# Patient Record
Sex: Male | Born: 1985 | Race: Black or African American | Hispanic: No | Marital: Single | Smoking: Never smoker
Health system: Southern US, Community
[De-identification: ages and names within clinical notes are randomized; demographics above are authoritative.]

---

## 2015-02-03 ENCOUNTER — Emergency Department (HOSPITAL_COMMUNITY)
Admission: EM | Admit: 2015-02-03 | Discharge: 2015-02-03 | Disposition: A | Discharge status: Home / Self Care | Payer: Self-pay | Attending: Emergency Medicine | Admitting: Emergency Medicine

## 2015-02-03 ENCOUNTER — Encounter (HOSPITAL_COMMUNITY): Payer: Self-pay | Admitting: *Deleted

## 2015-02-03 DIAGNOSIS — Z202 Contact with and (suspected) exposure to infections with a predominantly sexual mode of transmission: Secondary | ICD-10-CM | POA: Insufficient documentation

## 2015-02-03 DIAGNOSIS — R350 Frequency of micturition: Secondary | ICD-10-CM | POA: Insufficient documentation

## 2015-02-03 DIAGNOSIS — Z7251 High risk heterosexual behavior: Secondary | ICD-10-CM

## 2015-02-03 DIAGNOSIS — R300 Dysuria: Secondary | ICD-10-CM | POA: Insufficient documentation

## 2015-02-03 LAB — URINALYSIS, ROUTINE W REFLEX MICROSCOPIC
Bilirubin Urine: NEGATIVE
GLUCOSE, UA: NEGATIVE mg/dL
HGB URINE DIPSTICK: NEGATIVE
KETONES UR: NEGATIVE mg/dL
LEUKOCYTES UA: NEGATIVE
Nitrite: NEGATIVE
Protein, ur: NEGATIVE mg/dL
SPECIFIC GRAVITY, URINE: 1.021 (ref 1.005–1.030)
pH: 6 (ref 5.0–8.0)

## 2015-02-03 MED ORDER — METRONIDAZOLE 500 MG PO TABS
2000.0000 mg | ORAL_TABLET | Freq: Once | ORAL | Status: AC
  Administered 2015-02-03: 2000 mg via ORAL
  Filled 2015-02-03: qty 4

## 2015-02-03 MED ORDER — AZITHROMYCIN 250 MG PO TABS
1000.0000 mg | ORAL_TABLET | Freq: Once | ORAL | Status: AC
  Administered 2015-02-03: 1000 mg via ORAL
  Filled 2015-02-03: qty 4

## 2015-02-03 MED ORDER — CEFTRIAXONE SODIUM 250 MG IJ SOLR
250.0000 mg | Freq: Once | INTRAMUSCULAR | Status: AC
  Administered 2015-02-03: 250 mg via INTRAMUSCULAR
  Filled 2015-02-03: qty 250

## 2015-02-03 NOTE — Discharge Instructions (Signed)
Follow up with [facility] STD clinic for future STD concerns or screenings. This is the recommendation by the CDC for people with multiple sexual partners or history of STDs. You have been treated for trichomonas, gonorrhea, and chlamydia in the ER but the hospital will call you if lab is positive. Stay well hydrated. Have all sexual partners tested and treated. Do not engage in unprotected sex, and avoid sexual activity until your partner's symptoms resolve entirely. Follow up with [place] and wellness in 1-2 weeks to establish medical care. Return to the ER for changes or worsening symptoms   Sexually Transmitted Disease A sexually transmitted disease (STD) is a disease or infection often passed to another person during sex. However, STDs can be passed through nonsexual ways. An STD can be passed through:  Spit (saliva).  Semen.  Blood.  Mucus from the vagina.  Pee (urine). HOW CAN I LESSEN MY CHANCES OF GETTING AN STD?  Use:  Latex condoms.  Water-soluble lubricants with condoms. Do not use petroleum jelly or oils.  Dental dams. These are small pieces of latex that are used as a barrier during oral sex.  Avoid having more than one sex partner.  Do not have sex with someone who has other sex partners.  Do not have sex with anyone you do not know or who is at high risk for an STD.  Avoid risky sex that can break your skin.  Do not have sex if you have open sores on your mouth or skin.  Avoid drinking too much alcohol or taking illegal drugs. Alcohol and drugs can affect your good judgment.  Avoid oral and anal sex acts.  Get shots (vaccines) for HPV and hepatitis.  If you are at risk of being infected with HIV, it is advised that you take a certain medicine daily to prevent HIV infection. This is called pre-exposure prophylaxis (PrEP). You may be at risk if:  You are a man who has sex with other men (MSM).  You are attracted to the opposite sex  (heterosexual) and are having sex with more than one partner.  You take drugs with a needle.  You have sex with someone who has HIV.  Talk with your doctor about if you are at high risk of being infected with HIV. If you begin to take PrEP, get tested for HIV first. Get tested every 3 months for as long as you are taking PrEP.  Get tested for STDs every year if you are sexually active. If you are treated for an STD, get tested again 3 months after you are treated. WHAT SHOULD I DO IF I THINK I HAVE AN STD?  See your doctor.  Tell your sex partner(s) that you have an STD. They should be tested and treated.  Do not have sex until your doctor says it is okay. WHEN SHOULD I GET HELP? Get help right away if:  You have bad belly (abdominal) pain.  You are a man and have puffiness (swelling) or pain in your testicles.  You are a woman and have puffiness in your vagina.   This information is not intended to replace advice given to you by your health care provider. Make sure you discuss any questions you have with your health care provider.   Document Released: 02/06/2004 Document Revised: 01/19/2014 Document Reviewed: 06/24/2012 Elsevier Interactive Patient Education 2016 [employer] Inc[employer]  Dysuria Dysuria is pain or discomfort while urinating. The pain or discomfort may be felt in  the tube that carries urine out of the bladder (urethra) or in the surrounding tissue of the genitals. The pain may also be felt in the groin area, lower abdomen, and lower back. You may have to urinate frequently or have the sudden feeling that you have to urinate (urgency). Dysuria can affect both men and women, but is more common in women. Dysuria can be caused by many different things, including:  Urinary tract infection in women.  Infection of the kidney or bladder.  Kidney stones or bladder stones.  Certain sexually transmitted infections (STIs), such as chlamydia.  Dehydration.  Inflammation of the  vagina.  Use of certain medicines.  Use of certain soaps or scented products that cause irritation. HOME CARE INSTRUCTIONS Watch your dysuria for any changes. The following actions may help to reduce any discomfort you are feeling:  Drink enough fluid to keep your urine clear or pale yellow.  Empty your bladder often. Avoid holding urine for long periods of time.  After a bowel movement or urination, women should cleanse from front to back, using each tissue only once.  Empty your bladder after sexual intercourse.  Take medicines only as directed by your health care provider.  If you were prescribed an antibiotic medicine, finish it all even if you start to feel better.  Avoid caffeine, tea, and alcohol. They can irritate the bladder and make dysuria worse. In men, alcohol may irritate the prostate.  Keep all follow-up visits as directed by your health care provider. This is important.  If you had any tests done to find the cause of dysuria, it is your responsibility to obtain your test results. Ask the lab or department performing the test when and how you will get your results. Talk with your health care provider if you have any questions about your results. SEEK MEDICAL CARE IF:  You develop pain in your back or sides.  You have a fever.  You have nausea or vomiting.  You have blood in your urine.  You are not urinating as often as you usually do. SEEK IMMEDIATE MEDICAL CARE IF:  You pain is severe and not relieved with medicines.  You are unable to hold down any fluids.  You or someone else notices a change in your mental function.  You have a rapid heartbeat at rest.  You have shaking or chills.  You feel extremely weak.   This information is not intended to replace advice given to you by your health care provider. Make sure you discuss any questions you have with your health care provider.   Document Released: 09/27/2003 Document Revised: 01/19/2014 Document  Reviewed: 08/24/2013 Elsevier Interactive Patient Education [employer]

## 2015-02-03 NOTE — ED Notes (Signed)
Pt left with out being discharged.

## 2015-02-03 NOTE — ED Provider Notes (Signed)
CSN: [id number]     Arrival date & time 02/03/15  6962 History   First MD Initiated Contact with Patient 02/03/15 1009     Chief Complaint  Patient presents with  . Exposure to STD     (Consider location/radiation/quality/duration/timing/severity/associated sxs/prior Treatment) HPI Comments: James Horton is a 30 y.o. male who presents to the ED with complaints of concern for STD exposure. Patient states that last month his significant other and himself were tested and empirically treated for gonorrhea and chlamydia (states he got a shot and pills), and that his test result was negative. Last week his girlfriend tested positive for chlamydia, it's unclear whether she received full treatment at that time, but patient did not get retested. He is here today to be retreated for gonorrhea and chlamydia given his significant other's positive test. Of note, his S.O. Is here as well, she was tested for GC/CT last week and tested +chlamydia. Had neg RPR/HIV testing. She was apparently treated but she didn't know what she was given. She is here today and has been re-treated for GC/CT and trichomonas as well as a yeast infection since she also had that show up on her testing last week.  Patient states he has been having some burning dysuria and increased urinary frequency occasionally. He states he is only sexually active with one male partner, unprotected. He denies any fevers, chills, chest pain, shortness breath, abdominal pain, nausea, vomiting, diarrhea, constipation, malodorous urine, hematuria, penile discharge or pain, penile swelling, testicular pain or swelling, genital lesions, numbness, tingling, or weakness.  Patient is a 30 y.o. male presenting with STD exposure. The history is provided by the patient and a significant other. No language interpreter was used.  Exposure to STD This is a new problem. The current episode started in the past 7 days. The problem occurs constantly. The problem has  been unchanged. Associated symptoms include urinary symptoms. Pertinent negatives include no abdominal pain, arthralgias, chest pain, chills, fever, myalgias, nausea, numbness, vomiting or weakness. Nothing aggravates the symptoms. He has tried nothing for the symptoms. The treatment provided no relief.    History reviewed. No pertinent past medical history. History reviewed. No pertinent past surgical history. No family history on file. Social History  Substance Use Topics  . Smoking status: Never Smoker   . Smokeless tobacco: None  . Alcohol Use: No    Review of Systems  Constitutional: Negative for fever and chills.  Respiratory: Negative for shortness of breath.   Cardiovascular: Negative for chest pain.  Gastrointestinal: Negative for nausea, vomiting, abdominal pain, diarrhea and constipation.  Genitourinary: Positive for dysuria and frequency. Negative for hematuria, discharge, penile swelling, scrotal swelling, genital sores, penile pain and testicular pain.  Musculoskeletal: Negative for myalgias and arthralgias.  Skin: Negative for color change.  Allergic/Immunologic: Negative for immunocompromised state.  Neurological: Negative for weakness and numbness.  Psychiatric/Behavioral: Negative for confusion.   10 Systems reviewed and are negative for acute change except as noted in the HPI.    Allergies  Review of patient's allergies indicates no known allergies.  Home Medications   Prior to Admission medications   Not on File   Triage VS: BP 91/58 mmHg  Pulse 71  Temp(Src) 98.4 F (36.9 C) (Oral)  Resp 18  SpO2 100% Recheck: BP 108/71 mmHg  Pulse 62  Temp(Src) 98.4 F (36.9 C) (Oral)  Resp 17  SpO2 100%  Physical Exam  Constitutional: He is oriented to person, place, and time. Vital signs are normal.  He appears well-developed and well-nourished.  Non-toxic appearance. No distress.  Afebrile, nontoxic, NAD  HENT:  Head: Normocephalic and atraumatic.   Mouth/Throat: Oropharynx is clear and moist and mucous membranes are normal.  Eyes: Conjunctivae and EOM are normal. Right eye exhibits no discharge. Left eye exhibits no discharge.  Neck: Normal range of motion. Neck supple.  Cardiovascular: Normal rate, regular rhythm, normal heart sounds and intact distal pulses.  Exam reveals no gallop and no friction rub.   No murmur heard. Pulmonary/Chest: Effort normal and breath sounds normal. No respiratory distress. He has no decreased breath sounds. He has no wheezes. He has no rhonchi. He has no rales.  Abdominal: Soft. Normal appearance and bowel sounds are normal. He exhibits no distension. There is no tenderness. There is no rigidity, no rebound, no guarding, no CVA tenderness, no tenderness at McBurney's point and negative Murphy's sign.  Soft, NTND, +BS throughout, no r/g/r, neg murphy's, neg mcburney's, no CVA TTP   Genitourinary:  Deferred given recent GU exam  Musculoskeletal: Normal range of motion.  Neurological: He is alert and oriented to person, place, and time. He has normal strength. No sensory deficit.  Skin: Skin is warm, dry and intact. No rash noted.  Psychiatric: He has a normal mood and affect.  Nursing note and vitals reviewed.   ED Course  Procedures (including critical care time) Labs Review Labs Reviewed  URINALYSIS, ROUTINE W REFLEX MICROSCOPIC (NOT AT [facility])  GC/CHLAMYDIA PROBE AMP ([place]) NOT AT [facility]    Imaging Review No results found. I have personally reviewed and evaluated these images and lab results as part of my medical decision-making.   EKG Interpretation None      MDM   Final diagnoses:  Exposure to STD  Unprotected sex  Dysuria  Urinary frequency    30 y.o. male here for STD concerns. Was tested last month, treated empirically with shot and pills, and results were neg. S.O. Was also tested and treated at that time too. Last week, S.O. Was re-tested and had +chlamydia test. Unclear if  pt was fully treated at that time, again, but is here today and is being retreated for GC/CT/trich and yeast. Pt states he was not retested/treated last week (since his S.O. Was), and wants to be re-treated given the +result on his S.O. Testing. Has some dysuria and urinary frequency. Since pt just had penile exam and testing, and his S.O. Is + for chlamydia, will just empirically treat for GC/CT/trich today. Will get U/A and test for GC/CT again off this, but since his test last time was likely a false neg, still will proceed with tx. Doubt need for repeat GU exam today. Will await U/A then reassess.   11:24 AM U/A clear. Doubt need for reflex UCx given the fact that it's completely clear. Pt treated for GC/CT/Trich here. Discussed importance of using condoms. F/up with health dept for future STD concerns, and with [facility] in 1-2wks to establish medical care. I explained the diagnosis and have given explicit precautions to return to the ER including for any other new or worsening symptoms. The patient understands and accepts the medical plan as it's been dictated and I have answered their questions. Discharge instructions concerning home care and prescriptions have been given. The patient is STABLE and is discharged to home in good condition.  BP 108/71 mmHg  Pulse 62  Temp(Src) 98.4 F (36.9 C) (Oral)  Resp 17  SpO2 100%  Meds ordered this encounter  Medications  .  azithromycin (ZITHROMAX) tablet 1,000 mg    Sig:    And  . cefTRIAXone (ROCEPHIN) injection 250 mg    Sig:     Order Specific Question:  Antibiotic Indication:    Answer:  STD   And  . metroNIDAZOLE (FLAGYL) tablet 2,000 mg    Sig:       Kamarii Carton Camprubi-Soms, PA-C 02/03/15 1124  Pricilla Loveless, MD 02/05/15 [phone]

## 2015-02-03 NOTE — ED Notes (Signed)
Pt reports being tested last week Monday for STD and was negative.  Pt reports discomfort when urinating.  Pt's girlfriend is also being seen here today and states that she was checked last week and was + for chlamydia.

## 2015-05-10 ENCOUNTER — Emergency Department (HOSPITAL_COMMUNITY)
Admission: EM | Admit: 2015-05-10 | Discharge: 2015-05-10 | Disposition: A | Discharge status: Home / Self Care | Payer: Medicaid Other | Attending: Emergency Medicine | Admitting: Emergency Medicine

## 2015-05-10 ENCOUNTER — Encounter (HOSPITAL_COMMUNITY): Payer: Self-pay | Admitting: Emergency Medicine

## 2015-05-10 DIAGNOSIS — M25532 Pain in left wrist: Secondary | ICD-10-CM | POA: Insufficient documentation

## 2015-05-10 DIAGNOSIS — M654 Radial styloid tenosynovitis [de Quervain]: Secondary | ICD-10-CM | POA: Diagnosis not present

## 2015-05-10 DIAGNOSIS — M25531 Pain in right wrist: Secondary | ICD-10-CM | POA: Diagnosis present

## 2015-05-10 MED ORDER — NAPROXEN 500 MG PO TABS: 500.0000 mg | ORAL_TABLET | Freq: Two times a day (BID) | ORAL | Status: DC

## 2015-05-10 NOTE — ED Provider Notes (Signed)
CSN: [id number]     Arrival date & time 05/10/15  [id number] History   First MD Initiated Contact with Patient 05/10/15 1015     Chief Complaint  Patient presents with  . Wrist Pain   (Consider location/radiation/quality/duration/timing/severity/associated sxs/prior Treatment) HPI 30 y.o. male with a hx of Right Carpal Tunnel, presents to the Emergency Department today complaining of bilateral wrist pain since 05-03-15. Pt states that he began having a pressure sensation on bilateral wrists with use. Notes stiff/shooting sensation on wrist with use at work. Pain is 7-8/10 and comes and goes. Has tried Ibuprofen with minimal relief. Denies any injury/trauma to the area. No fevers. No other symptoms noted.   History reviewed. No pertinent past medical history. History reviewed. No pertinent past surgical history. No family history on file. Social History  Substance Use Topics  . Smoking status: Never Smoker   . Smokeless tobacco: None  . Alcohol Use: No    Review of Systems  Constitutional: Negative for fever.  Musculoskeletal: Positive for arthralgias. Negative for myalgias and joint swelling.   Allergies  Review of patient's allergies indicates no known allergies.  Home Medications   Prior to Admission medications   Not on File   BP 99/59 mmHg  Pulse 67  Temp(Src) 98.2 F (36.8 C) (Oral)  Resp 12  Ht 5\' 6"  (1.676 m)  Wt 53.524 kg  BMI 19.05 kg/m2  SpO2 99%   Physical Exam  Constitutional: He is oriented to person, place, and time. He appears well-developed and well-nourished.  HENT:  Head: Normocephalic and atraumatic.  Eyes: EOM are normal.  Cardiovascular: Normal rate and regular rhythm.   Pulmonary/Chest: Effort normal.  Abdominal: Soft.  Musculoskeletal: Normal range of motion.       Right wrist: He exhibits tenderness. He exhibits normal range of motion, no bony tenderness, no swelling, no effusion, no crepitus, no deformity and no laceration.       Left wrist: He  exhibits tenderness. He exhibits normal range of motion, no bony tenderness, no swelling, no effusion, no crepitus, no deformity and no laceration.  Positive Finklestein bilateral wrists. Neg Phalens. Distal pulses intact. Cap refill <2sec. Neurovascularly intact. Good motor/sensation   Neurological: He is alert and oriented to person, place, and time.  Skin: Skin is warm and dry.  Psychiatric: He has a normal mood and affect. His behavior is normal. Thought content normal.  Nursing note and vitals reviewed.  ED Course  Procedures (including critical care time) Labs Review Labs Reviewed - No data to display  Imaging Review No results found. I have personally reviewed and evaluated these images and lab results as part of my medical decision-making.   EKG Interpretation None      MDM  I have reviewed the relevant previous healthcare records. I obtained HPI from historian.  ED Course:  Assessment: Pt is a 30yM with hx Carpal tunnel who presents with bilateral wrist pain movement. No trauma to the area. On exam, pt in NAD. Nontoxic/nonseptic appearing. VSS. Afebrile. Pain with ROM of wrist. Positive Finkle. Neg Phalens. Neurovascularly intact. Suspect DeQuervains Tenosynovitis. Given splint. NSAIDs. Follow up with Orthopedics for further evaluation. Plan is to DC home. At time of discharge, Patient is in no acute distress. Vital Signs are stable. Patient is able to ambulate. Patient able to tolerate PO.    Disposition/Plan:  DC Home Additional Verbal discharge instructions given and discussed with patient.  Pt Instructed to f/u with Ortho for evaluation and treatment of symptoms. Return  precautions given Pt acknowledges and agrees with plan  Supervising Physician Donnetta Hutching, MD   Final diagnoses:  Suzette Battiest tenosynovitis, bilateral     Audry Pili, PA-C 05/10/15 1049  Donnetta Hutching, MD 05/10/15 [phone]

## 2015-05-10 NOTE — Discharge Instructions (Signed)
Please read and follow all provided instructions.  Your diagnoses today include:  1. De Quervain's tenosynovitis, bilateral    Tests performed today include:  Vital signs. See below for your results today.   Medications prescribed:   Take as prescribed    Home care instructions:  Follow any educational materials contained in this packet.  Follow-up instructions: Please follow-up with your Orthopedic Surgery for further evaluation of symptoms and treatment   Return instructions:   Please return to the Emergency Department if you do not get better, if you get worse, or new symptoms OR  - Fever (temperature greater than 101.91F)  - Bleeding that does not stop with holding pressure to the area    -Severe pain (please note that you may be more sore the day after your accident)  - Chest Pain  - Difficulty breathing  - Severe nausea or vomiting  - Inability to tolerate food and liquids  - Passing out  - Skin becoming red around your wounds  - Change in mental status (confusion or lethargy)  - New numbness or weakness     Please return if you have any other emergent concerns.  Additional Information:  Your vital signs today were: BP 99/59 mmHg   Pulse 67   Temp(Src) 98.2 F (36.8 C) (Oral)   Resp 12   Ht 5\' 6"  (1.676 m)   Wt 53.524 kg   BMI 19.05 kg/m2   SpO2 99% If your blood pressure (BP) was elevated above 135/85 this visit, please have this repeated by your doctor within one month. ---------------

## 2015-05-10 NOTE — ED Notes (Signed)
Bed: WA27 Expected date:  Expected time:  Means of arrival:  Comments: 

## 2015-05-10 NOTE — ED Notes (Signed)
Pt reports bilateral wrist pain onset 4/21; denies injury; reports hx of right carpel tunnel.

## 2016-05-18 ENCOUNTER — Emergency Department (HOSPITAL_COMMUNITY)
Admission: EM | Admit: 2016-05-18 | Discharge: 2016-05-18 | Disposition: A | Discharge status: Home / Self Care | Payer: Medicaid Other | Attending: Emergency Medicine | Admitting: Emergency Medicine

## 2016-05-18 ENCOUNTER — Encounter (HOSPITAL_COMMUNITY): Payer: Self-pay | Admitting: Emergency Medicine

## 2016-05-18 DIAGNOSIS — R112 Nausea with vomiting, unspecified: Secondary | ICD-10-CM

## 2016-05-18 DIAGNOSIS — R197 Diarrhea, unspecified: Secondary | ICD-10-CM | POA: Insufficient documentation

## 2016-05-18 LAB — URINALYSIS, ROUTINE W REFLEX MICROSCOPIC
Bacteria, UA: NONE SEEN
Bilirubin Urine: NEGATIVE
GLUCOSE, UA: NEGATIVE mg/dL
Hgb urine dipstick: NEGATIVE
Ketones, ur: 20 mg/dL — AB
Leukocytes, UA: NEGATIVE
NITRITE: NEGATIVE
PH: 5 (ref 5.0–8.0)
PROTEIN: 30 mg/dL — AB
SPECIFIC GRAVITY, URINE: 1.032 — AB (ref 1.005–1.030)

## 2016-05-18 LAB — CBC
HCT: 43.7 % (ref 39.0–52.0)
Hemoglobin: 15.1 g/dL (ref 13.0–17.0)
MCH: 30.4 pg (ref 26.0–34.0)
MCHC: 34.6 g/dL (ref 30.0–36.0)
MCV: 87.9 fL (ref 78.0–100.0)
PLATELETS: 220 10*3/uL (ref 150–400)
RBC: 4.97 MIL/uL (ref 4.22–5.81)
RDW: 13.1 % (ref 11.5–15.5)
WBC: 9 10*3/uL (ref 4.0–10.5)

## 2016-05-18 LAB — COMPREHENSIVE METABOLIC PANEL
ALBUMIN: 4.3 g/dL (ref 3.5–5.0)
ALK PHOS: 48 U/L (ref 38–126)
ALT: 26 U/L (ref 17–63)
AST: 30 U/L (ref 15–41)
Anion gap: 9 (ref 5–15)
BILIRUBIN TOTAL: 0.6 mg/dL (ref 0.3–1.2)
BUN: 13 mg/dL (ref 6–20)
CALCIUM: 9.5 mg/dL (ref 8.9–10.3)
CO2: 26 mmol/L (ref 22–32)
CREATININE: 1 mg/dL (ref 0.61–1.24)
Chloride: 103 mmol/L (ref 101–111)
GFR calc Af Amer: >60 mL/min (ref >60)
GFR calc non Af Amer: >60 mL/min (ref >60)
GLUCOSE: 112 mg/dL — AB (ref 65–99)
Potassium: 3.6 mmol/L (ref 3.5–5.1)
SODIUM: 138 mmol/L (ref 135–145)
TOTAL PROTEIN: 7.8 g/dL (ref 6.5–8.1)

## 2016-05-18 LAB — LIPASE, BLOOD: Lipase: 19 U/L (ref 11–51)

## 2016-05-18 MED ORDER — KETOROLAC TROMETHAMINE 30 MG/ML IJ SOLN
30.0000 mg | Freq: Once | INTRAMUSCULAR | Status: AC
  Administered 2016-05-18: 30 mg via INTRAVENOUS
  Filled 2016-05-18: qty 1

## 2016-05-18 MED ORDER — ONDANSETRON 4 MG PO TBDP
4.0000 mg | ORAL_TABLET | Freq: Three times a day (TID) | ORAL | 0 refills | Status: DC | PRN
Start: 2016-05-18 — End: 2021-01-19

## 2016-05-18 MED ORDER — ONDANSETRON HCL 4 MG/2ML IJ SOLN
4.0000 mg | Freq: Once | INTRAMUSCULAR | Status: AC
  Administered 2016-05-18: 4 mg via INTRAVENOUS
  Filled 2016-05-18: qty 2

## 2016-05-18 MED ORDER — SODIUM CHLORIDE 0.9 % IV BOLUS (SEPSIS)
1000.0000 mL | Freq: Once | INTRAVENOUS | Status: AC
Start: 2016-05-18 — End: 2016-05-18
  Administered 2016-05-18: 1000 mL via INTRAVENOUS

## 2016-05-18 NOTE — Discharge Instructions (Signed)
Your lab work today looked good. Take zofran when needed for nausea.  Stay hydrated with oral fluids, gentle diet and progress back to normal as tolerated. Return here for new concerns.

## 2016-05-18 NOTE — ED Notes (Signed)
Patient c/o nausea with diarrhea x 2 days , states he called ems last pm however refused to come because his girlfriend couldn't come with him. States pain is 7/10. States he hasn't been able to eat.

## 2016-05-18 NOTE — ED Notes (Signed)
Aware he needs a urine spec.

## 2016-05-18 NOTE — ED Notes (Signed)
Pt ambulated to restroom from room, tolerated well. 

## 2016-05-18 NOTE — ED Triage Notes (Signed)
Pt to ER for nausea vomiting and diarrhea onset yesterday with cough as well. Reports decrease in appetite. 2 episodes of diarrhea and 0 emesis, but constant nausea.

## 2016-05-18 NOTE — ED Provider Notes (Signed)
[facility]-EMERGENCY DEPT Provider Note   CSN: [id number] Arrival date & time: 05/18/16  0720     History   Chief Complaint Chief Complaint  Patient presents with  . Nausea  . Emesis    HPI James Horton is a 31 y.o. male.  The history is provided by the patient and medical records.  Emesis   Associated symptoms include diarrhea.   31 year old male here with nausea, vomiting, diarrhea. States is been ongoing for about 2 days now. Reports whenever he tries to eat or drink he began vomiting. Last emesis was yesterday but he has had consistent nausea since that time. Reports he did have a loose stool earlier this morning that was mostly liquid. No bloody diarrhea or emesis. He denies any fever or chills. Does have some abdominal cramping but thinks it is because he has not been able to eat.  States his son has been sick with URI type symptoms. He has not had any recent travel, abnormal food intake, or other sick contacts. He did try taking some NyQuil for his symptoms is discharged to sleep without change.  History reviewed. No pertinent past medical history.  There are no active problems to display for this patient.   History reviewed. No pertinent surgical history.     Home Medications    Prior to Admission medications   Medication Sig Start Date End Date Taking? Authorizing Provider  naproxen (NAPROSYN) 500 MG tablet Take 1 tablet (500 mg total) by mouth 2 (two) times daily. 05/10/15   Audry PiliMohr, [place], PA-C    Family History History reviewed. No pertinent family history.  Social History Social History  Substance Use Topics  . Smoking status: Never Smoker  . Smokeless tobacco: Never Used  . Alcohol use No     Allergies   Patient has no known allergies.   Review of Systems Review of Systems  Gastrointestinal: Positive for diarrhea, nausea and vomiting.  All other systems reviewed and are negative.    Physical Exam Updated Vital Signs BP 102/65 (BP Location:  Left Arm)   Pulse 88   Temp 99.8 F (37.7 C) (Oral)   Resp 18   Ht 5\' 6"  (1.676 m)   Wt 52.2 kg   SpO2 99%   BMI 18.56 kg/m   Physical Exam  Constitutional: He is oriented to person, place, and time. He appears well-developed and well-nourished.  HENT:  Head: Normocephalic and atraumatic.  Mouth/Throat: Oropharynx is clear and moist.  Eyes: Conjunctivae and EOM are normal. Pupils are equal, round, and reactive to light.  Neck: Normal range of motion.  Cardiovascular: Normal rate, regular rhythm and normal heart sounds.   Pulmonary/Chest: Effort normal and breath sounds normal. No respiratory distress. He has no wheezes.  Abdominal: Soft. Bowel sounds are normal. There is no tenderness. There is no rebound.  Musculoskeletal: Normal range of motion.  Neurological: He is alert and oriented to person, place, and time.  Skin: Skin is warm and dry.  Psychiatric: He has a normal mood and affect.  Nursing note and vitals reviewed.    ED Treatments / Results  Labs (all labs ordered are listed, but only abnormal results are displayed) Labs Reviewed  COMPREHENSIVE METABOLIC PANEL - Abnormal; Notable for the following:       Result Value   Glucose, Bld 112 (*)    All other components within normal limits  URINALYSIS, ROUTINE W REFLEX MICROSCOPIC - Abnormal; Notable for the following:    Color, Urine AMBER (*)  APPearance HAZY (*)    Specific Gravity, Urine 1.032 (*)    Ketones, ur 20 (*)    Protein, ur 30 (*)    Squamous Epithelial / LPF 0-5 (*)    All other components within normal limits  LIPASE, BLOOD  CBC    EKG  EKG Interpretation None       Radiology No results found.  Procedures Procedures (including critical care time)  Medications Ordered in ED Medications  sodium chloride 0.9 % bolus 1,000 mL (0 mLs Intravenous Stopped 05/18/16 0955)  ondansetron (ZOFRAN) injection 4 mg (4 mg Intravenous Given 05/18/16 0904)  ketorolac (TORADOL) 30 MG/ML injection 30 mg  (30 mg Intravenous Given 05/18/16 0904)     Initial Impression / Assessment and Plan / ED Course  I have reviewed the triage vital signs and the nursing notes.  Pertinent labs & imaging results that were available during my care of the patient were reviewed by me and considered in my medical decision making (see chart for details).  31 year old male here with nausea, vomiting, diarrhea past 2 days. He is afebrile and nontoxic. Abdomen soft and benign. Reports his son has been sick recently with URI type symptoms. Screening lab work here is reassuring. Patient treated here with IV fluids, Toradol, and Zofran with resolution of symptoms. States he is feeling better. He has been able to drink fluids here without issue.  No active emesis her in ED.  Appears stable for discharge.  Will have him continue oral hydration, gentle diet, zofran PRN.  Follow-up with PCP if any ongoing issues.  Discussed plan with patient, he acknowledged understanding and agreed with plan of care.  Return precautions given for new or worsening symptoms.  Final Clinical Impressions(s) / ED Diagnoses   Final diagnoses:  Nausea vomiting and diarrhea    New Prescriptions New Prescriptions   ONDANSETRON (ZOFRAN ODT) 4 MG DISINTEGRATING TABLET    Take 1 tablet (4 mg total) by mouth every 8 (eight) hours as needed for nausea.     Garlon Hatchet, PA-C 05/18/16 1114    Gerhard Munch, MD 05/20/16 2034

## 2018-11-07 ENCOUNTER — Encounter (HOSPITAL_COMMUNITY): Payer: Self-pay | Admitting: Emergency Medicine

## 2018-11-07 ENCOUNTER — Emergency Department (HOSPITAL_COMMUNITY)
Admission: EM | Admit: 2018-11-07 | Discharge: 2018-11-07 | Disposition: A | Discharge status: Home / Self Care | Payer: Self-pay | Attending: Emergency Medicine | Admitting: Emergency Medicine

## 2018-11-07 ENCOUNTER — Other Ambulatory Visit: Payer: Self-pay

## 2018-11-07 ENCOUNTER — Emergency Department (HOSPITAL_COMMUNITY): Payer: Self-pay

## 2018-11-07 DIAGNOSIS — M25531 Pain in right wrist: Secondary | ICD-10-CM | POA: Insufficient documentation

## 2018-11-07 MED ORDER — MELOXICAM 7.5 MG PO TABS: 7.5000 mg | ORAL_TABLET | Freq: Every day | ORAL | 0 refills | Status: DC

## 2018-11-07 NOTE — ED Provider Notes (Signed)
[facility] DEPT Provider Note   CSN: [id number] Arrival date & time: 11/07/18  3016     History   Chief Complaint Chief Complaint  Patient presents with  . Hand Pain    HPI James Horton is a 33 y.o. male.  Presents emerge department chief complaint of right wrist pain.  Patient states has been having the pain over the past few days, denies any trauma, no associated swelling, redness, fever.  Patient states pain is worse with certain movements, heavy use.  States pain is dull, achy, radiates to elbow and hand.  States that he has been diagnosed with carpal tunnel syndrome in his left hand and feels this is similar.  Has not been evaluated by an orthopedist.  Has been taking his girlfriend's Toradol intermittently with no change in symptoms.  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  He is right-hand dominant.     HPI  History reviewed. No pertinent past medical history.  There are no active problems to display for this patient.   History reviewed. No pertinent surgical history.      Home Medications    Prior to Admission medications   Medication Sig Start Date End Date Taking? Authorizing Provider  meloxicam (MOBIC) 7.5 MG tablet Take 1 tablet (7.5 mg total) by mouth daily. 11/07/18   Lucrezia Starch, MD  naproxen (NAPROSYN) 500 MG tablet Take 1 tablet (500 mg total) by mouth 2 (two) times daily. Patient not taking: Reported on 05/18/2016 05/10/15   Shary Decamp, PA-C  ondansetron (ZOFRAN ODT) 4 MG disintegrating tablet Take 1 tablet (4 mg total) by mouth every 8 (eight) hours as needed for nausea. 05/18/16   Larene Pickett, PA-C    Family History No family history on file.  Social History Social History   Tobacco Use  . Smoking status: Never Smoker  . Smokeless tobacco: Never Used  Substance Use Topics  . Alcohol use: No  . Drug use: No     Allergies   Patient has no known  allergies.   Review of Systems Review of Systems  Constitutional: Negative for chills and fever.  HENT: Negative for ear pain and sore throat.   Eyes: Negative for pain and visual disturbance.  Respiratory: Negative for cough and shortness of breath.   Cardiovascular: Negative for chest pain and palpitations.  Gastrointestinal: Negative for abdominal pain and vomiting.  Genitourinary: Negative for dysuria and hematuria.  Musculoskeletal: Positive for arthralgias. Negative for back pain and myalgias.  Skin: Negative for color change and rash.  Neurological: Negative for seizures and syncope.  All other systems reviewed and are negative.    Physical Exam Updated Vital Signs BP 112/66 (BP Location: Left Arm)   Pulse 63   Temp 98.7 F (37.1 C) (Oral)   Resp 16   Ht 5\' 6"  (1.676 m)   Wt 52.2 kg   SpO2 100%   BMI 18.56 kg/m   Physical Exam Vitals signs and nursing note reviewed.  Constitutional:      Appearance: He is well-developed.  HENT:     Head: Normocephalic and atraumatic.  Eyes:     Conjunctiva/sclera: Conjunctivae normal.  Neck:     Musculoskeletal: Neck supple.  Cardiovascular:     Rate and Rhythm: Normal rate and regular rhythm.     Heart sounds: No murmur.  Pulmonary:     Effort: Pulmonary effort is normal. No respiratory distress.  Breath sounds: Normal breath sounds.  Abdominal:     Palpations: Abdomen is soft.     Tenderness: There is no abdominal tenderness.  Musculoskeletal:     Comments: RUE: there is some TTP over right wrist, no obvious deformity, no swelling noted throughout extremity, normal hand, wrist, elbow range of motion, no erythema, negative Tinnel's sign; normal radial pulse  Skin:    General: Skin is warm and dry.  Neurological:     General: No focal deficit present.     Mental Status: He is alert and oriented to person, place, and time.     Comments: Sensation intact in RUE, 5/5 strength in RUE      ED Treatments / Results   Labs (all labs ordered are listed, but only abnormal results are displayed) Labs Reviewed - No data to display  EKG None  Radiology Dg Wrist Complete Right  Result Date: 11/07/2018 CLINICAL DATA:  Tenderness to palpation at right wrist EXAM: RIGHT WRIST - COMPLETE 3+ VIEW COMPARISON:  None. FINDINGS: There is no evidence of fracture or dislocation. There is no evidence of arthropathy or other focal bone abnormality. Soft tissues are unremarkable. IMPRESSION: Negative. Electronically Signed   By: Charlett Nose M.D.   On: 11/07/2018 11:24    Procedures Procedures (including critical care time)  Medications Ordered in ED Medications - No data to display   Initial Impression / Assessment and Plan / ED Course  I have reviewed the triage vital signs and the nursing notes.  Pertinent labs & imaging results that were available during my care of the patient were reviewed by me and considered in my medical decision making (see chart for details).  Clinical Course as of Nov 06 1333  Mon Nov 07, 2018  1128 Reviewed x-ray, will update patient and discharge   [RD]    Clinical Course User Index [RD] Milagros Loll, MD       33 year old male with atraumatic right wrist pain.  No significant physical exam abnormalities noted except mild amount of tenderness on his wrist.  Neurovascularly intact.  X-rays negative.  Possible carpal tunnel syndrome versus wrist strain.  Recommend trial of NSAIDs, utilizing a wrist brace and follow-up with hand surgery.    After the discussed management above, the patient was determined to be safe for discharge.  The patient was in agreement with this plan and all questions regarding their care were answered.  ED return precautions were discussed and the patient will return to the ED with any significant worsening of condition.   Final Clinical Impressions(s) / ED Diagnoses   Final diagnoses:  Right wrist pain    ED Discharge Orders         Ordered     meloxicam (MOBIC) 7.5 MG tablet  Daily     11/07/18 1048           Milagros Loll, MD 11/07/18 1335

## 2018-11-07 NOTE — Discharge Instructions (Addendum)
Recommend taking the Mobic daily for pain control of your wrist pain.  Recommend continuing the splint as discussed.  Recommend following up with orthopedic surgery for your wrist pain.  If you develop worsening numbness, swelling, fever, redness, please return to ER for recheck.  Would also recommend generally resting your wrist if possible, in particular recommend avoiding heavy lifting.  Recommend gentle range of motion exercises and gentle use.

## 2018-11-07 NOTE — ED Notes (Signed)
XR at bedside

## 2018-11-07 NOTE — ED Notes (Signed)
Pt is in triage 8.

## 2018-11-07 NOTE — ED Triage Notes (Signed)
Pt complaint of right hand pain extending to elbow; hx of carpel tunnel. Denies injury. Pain onset 4 days ago.

## 2020-05-23 DIAGNOSIS — W268XXA Contact with other sharp object(s), not elsewhere classified, initial encounter: Secondary | ICD-10-CM | POA: Insufficient documentation

## 2020-05-23 DIAGNOSIS — Y990 Civilian activity done for income or pay: Secondary | ICD-10-CM | POA: Insufficient documentation

## 2020-05-23 DIAGNOSIS — Z23 Encounter for immunization: Secondary | ICD-10-CM | POA: Insufficient documentation

## 2020-05-23 DIAGNOSIS — Z5321 Procedure and treatment not carried out due to patient leaving prior to being seen by health care provider: Secondary | ICD-10-CM | POA: Insufficient documentation

## 2020-05-23 DIAGNOSIS — S61210A Laceration without foreign body of right index finger without damage to nail, initial encounter: Secondary | ICD-10-CM | POA: Insufficient documentation

## 2020-05-24 ENCOUNTER — Emergency Department (HOSPITAL_COMMUNITY)
Admission: EM | Admit: 2020-05-24 | Discharge: 2020-05-24 | Discharge status: Against Medical Advice | Payer: Medicaid Other | Attending: Emergency Medicine | Admitting: Emergency Medicine

## 2020-05-24 ENCOUNTER — Other Ambulatory Visit: Payer: Self-pay

## 2020-05-24 ENCOUNTER — Encounter (HOSPITAL_COMMUNITY): Payer: Self-pay

## 2020-05-24 DIAGNOSIS — S61210A Laceration without foreign body of right index finger without damage to nail, initial encounter: Secondary | ICD-10-CM

## 2020-05-24 MED ORDER — LIDOCAINE HCL (PF) 1 % IJ SOLN
5.0000 mL | Freq: Once | INTRAMUSCULAR | Status: AC
  Administered 2020-05-24: 5 mL
  Filled 2020-05-24: qty 30

## 2020-05-24 MED ORDER — TETANUS-DIPHTH-ACELL PERTUSSIS 5-2.5-18.5 LF-MCG/0.5 IM SUSY
0.5000 mL | PREFILLED_SYRINGE | Freq: Once | INTRAMUSCULAR | Status: AC
  Administered 2020-05-24: 0.5 mL via INTRAMUSCULAR
  Filled 2020-05-24: qty 0.5

## 2020-05-24 NOTE — ED Provider Notes (Signed)
[facility]-EMERGENCY DEPT Provider Note   CSN: [id number] Arrival date & time: 05/23/20  2338     History Chief Complaint  Patient presents with  . Finger Injury    James Horton is a 35 y.o. male presents to the Emergency Department complaining of acute, persistent laceration to the right pointer finger just over the DIP.  Patient reports it happened around 1 PM.  Unknown last tetanus.  Patient does not want to take the Band-Aid off or cooperate with exam.  Reports this happened at work with a box cutter and he knows that he needs stitches.  Movement and palpation make the symptoms worse.  Nothing seems to make them better.  The history is provided by the patient and medical records. No language interpreter was used.       History reviewed. No pertinent past medical history.  There are no problems to display for this patient.   History reviewed. No pertinent surgical history.     No family history on file.  Social History   Tobacco Use  . Smoking status: Never Smoker  . Smokeless tobacco: Never Used  Substance Use Topics  . Alcohol use: No  . Drug use: No    Home Medications Prior to Admission medications   Medication Sig Start Date End Date Taking? Authorizing Provider  meloxicam (MOBIC) 7.5 MG tablet Take 1 tablet (7.5 mg total) by mouth daily. 11/07/18   Milagros Loll, MD  naproxen (NAPROSYN) 500 MG tablet Take 1 tablet (500 mg total) by mouth 2 (two) times daily. Patient not taking: Reported on 05/18/2016 05/10/15   Audry Pili, PA-C  ondansetron (ZOFRAN ODT) 4 MG disintegrating tablet Take 1 tablet (4 mg total) by mouth every 8 (eight) hours as needed for nausea. 05/18/16   Garlon Hatchet, PA-C    Allergies    Patient has no known allergies.  Review of Systems   Review of Systems  Constitutional: Negative for chills and fever.  Skin: Positive for wound.    Physical Exam Updated Vital Signs BP 118/61   Pulse 77   Temp 98.3  F (36.8 C)   Resp (!) 116   Ht 5\' 6"  (1.676 m)   Wt 47.6 kg   SpO2 97%   BMI 16.95 kg/m   Physical Exam Vitals and nursing note reviewed.  Constitutional:      General: He is not in acute distress.    Appearance: He is well-developed.  HENT:     Head: Normocephalic.  Eyes:     General: No scleral icterus.    Conjunctiva/sclera: Conjunctivae normal.  Cardiovascular:     Rate and Rhythm: Normal rate.  Pulmonary:     Effort: Pulmonary effort is normal.  Musculoskeletal:     Cervical back: Normal range of motion.  Skin:    General: Skin is warm and dry.     Capillary Refill: Capillary refill takes less than 2 seconds.     Comments: 2 cm laceration over the lateral side of the DIP of the right pointer finger.  Neurological:     Mental Status: He is alert.     Comments: Sensation intact distal to the laceration.  Patient refuses to allow strength testing or range of motion testing.     ED Results / Procedures / Treatments    Procedures Procedures   Medications Ordered in ED Medications  Tdap (BOOSTRIX) injection 0.5 mL (0.5 mLs Intramuscular Given 05/24/20 0201)  lidocaine (PF) (XYLOCAINE) 1 % injection  5 mL (5 mLs Infiltration Given 05/24/20 0201)    ED Course  I have reviewed the triage vital signs and the nursing notes.  Pertinent labs & imaging results that were available during my care of the patient were reviewed by me and considered in my medical decision making (see chart for details).    MDM Rules/Calculators/A&P                           With a laceration to the right pointer finger.  Limited exam as patient is uncooperative.  Tdap ordered and given.  Patient then eloped from the emergency department before I was able to clean or repair laceration.   Final Clinical Impression(s) / ED Diagnoses Final diagnoses:  Laceration of right index finger without damage to nail, foreign body presence unspecified, initial encounter    Rx / DC Orders ED Discharge  Orders    None       Kwabena Strutz, Boyd Kerbs 05/24/20 0352    Zadie Rhine, MD 05/25/20 [phone]

## 2020-05-24 NOTE — ED Triage Notes (Signed)
Pt to ED by POV from home with c/o lac to top of R pointer finger. Pt states he cut it with a box cutter at lunctime. Pt has a bandaid covering wound and will not let this RN remove the bandaid. Bandaid is clean and intact, bleeding is controlled. VSS, NADN.

## 2021-01-17 ENCOUNTER — Encounter (HOSPITAL_COMMUNITY): Payer: Self-pay

## 2021-01-17 ENCOUNTER — Other Ambulatory Visit: Payer: Self-pay

## 2021-01-17 ENCOUNTER — Emergency Department (HOSPITAL_COMMUNITY): Payer: Self-pay

## 2021-01-17 ENCOUNTER — Emergency Department (HOSPITAL_COMMUNITY)
Admission: EM | Admit: 2021-01-17 | Discharge: 2021-01-18 | Disposition: A | Discharge status: Home / Self Care | Payer: Self-pay | Attending: Emergency Medicine | Admitting: Emergency Medicine

## 2021-01-17 DIAGNOSIS — D72829 Elevated white blood cell count, unspecified: Secondary | ICD-10-CM | POA: Insufficient documentation

## 2021-01-17 DIAGNOSIS — K529 Noninfective gastroenteritis and colitis, unspecified: Secondary | ICD-10-CM | POA: Insufficient documentation

## 2021-01-17 DIAGNOSIS — Z20822 Contact with and (suspected) exposure to covid-19: Secondary | ICD-10-CM | POA: Insufficient documentation

## 2021-01-17 DIAGNOSIS — K921 Melena: Secondary | ICD-10-CM | POA: Insufficient documentation

## 2021-01-17 DIAGNOSIS — R1084 Generalized abdominal pain: Secondary | ICD-10-CM

## 2021-01-17 LAB — RESP PANEL BY RT-PCR (FLU A&B, COVID) ARPGX2
Influenza A by PCR: NEGATIVE
Influenza B by PCR: NEGATIVE
SARS Coronavirus 2 by RT PCR: NEGATIVE

## 2021-01-17 LAB — CBC WITH DIFFERENTIAL/PLATELET
Abs Immature Granulocytes: 0.03 10*3/uL (ref 0.00–0.07)
Basophils Absolute: 0.1 10*3/uL (ref 0.0–0.1)
Basophils Relative: 1 %
Eosinophils Absolute: 0.1 10*3/uL (ref 0.0–0.5)
Eosinophils Relative: 1 %
HCT: 47 % (ref 39.0–52.0)
Hemoglobin: 15.8 g/dL (ref 13.0–17.0)
Immature Granulocytes: 0 %
Lymphocytes Relative: 23 %
Lymphs Abs: 2.5 10*3/uL (ref 0.7–4.0)
MCH: 30.7 pg (ref 26.0–34.0)
MCHC: 33.6 g/dL (ref 30.0–36.0)
MCV: 91.4 fL (ref 80.0–100.0)
Monocytes Absolute: 1 10*3/uL (ref 0.1–1.0)
Monocytes Relative: 9 %
Neutro Abs: 7.3 10*3/uL (ref 1.7–7.7)
Neutrophils Relative %: 66 %
Platelets: 341 10*3/uL (ref 150–400)
RBC: 5.14 MIL/uL (ref 4.22–5.81)
RDW: 13.5 % (ref 11.5–15.5)
WBC: 10.9 10*3/uL — ABNORMAL HIGH (ref 4.0–10.5)
nRBC: 0 % (ref 0.0–0.2)

## 2021-01-17 LAB — POC OCCULT BLOOD, ED: Fecal Occult Bld: POSITIVE — AB

## 2021-01-17 LAB — COMPREHENSIVE METABOLIC PANEL
ALT: 17 U/L (ref 0–44)
AST: 19 U/L (ref 15–41)
Albumin: 4.3 g/dL (ref 3.5–5.0)
Alkaline Phosphatase: 51 U/L (ref 38–126)
Anion gap: 11 (ref 5–15)
BUN: 14 mg/dL (ref 6–20)
CO2: 25 mmol/L (ref 22–32)
Calcium: 9.1 mg/dL (ref 8.9–10.3)
Chloride: 100 mmol/L (ref 98–111)
Creatinine, Ser: 0.93 mg/dL (ref 0.61–1.24)
GFR, Estimated: >60 mL/min (ref >60)
Glucose, Bld: 103 mg/dL — ABNORMAL HIGH (ref 70–99)
Potassium: 3.6 mmol/L (ref 3.5–5.1)
Sodium: 136 mmol/L (ref 135–145)
Total Bilirubin: 0.7 mg/dL (ref 0.3–1.2)
Total Protein: 8.1 g/dL (ref 6.5–8.1)

## 2021-01-17 LAB — LIPASE, BLOOD: Lipase: 27 U/L (ref 11–51)

## 2021-01-17 MED ORDER — IOHEXOL 350 MG/ML SOLN
80.0000 mL | Freq: Once | INTRAVENOUS | Status: AC | PRN
  Administered 2021-01-17: 80 mL via INTRAVENOUS

## 2021-01-17 MED ORDER — MORPHINE SULFATE (PF) 4 MG/ML IV SOLN
4.0000 mg | Freq: Once | INTRAVENOUS | Status: AC
  Administered 2021-01-17: 4 mg via INTRAVENOUS
  Filled 2021-01-17: qty 1

## 2021-01-17 NOTE — ED Triage Notes (Signed)
Patient c/o mid abdominal pain, blood in diarrhea, and nausea x 5 days.

## 2021-01-17 NOTE — ED Provider Notes (Signed)
[facility] DEPT Provider Note   CSN: [id number] Arrival date & time: 01/17/21  [id number]     History  Chief Complaint  Patient presents with   Abdominal Pain   Diarrhea   Blood In Stools   Nausea    James Horton is a 36 y.o. male.  HPI Patient is a 36 year old male with no pertinent medical history who presents to the emergency department due to bloody diarrhea.  Patient states he began having abdominal pain about 5 days ago.  Describes it as crampy and intermittent.  Also reports intermittent bloody diarrhea for the past 3 days.  States he is having 6-7 bouts per day.  Reports associated nausea without vomiting.  No urinary complaints.  No fevers, chills, or URI symptoms.  Denies any recent travel.  Denies any recent antibiotic use.  Denies any frequent alcohol use or NSAID use.    Home Medications Prior to Admission medications   Medication Sig Start Date End Date Taking? Authorizing Provider  ciprofloxacin (CIPRO) 500 MG tablet Take 1 tablet (500 mg total) by mouth every 12 (twelve) hours. 01/18/21  Yes Rayna Sexton, PA-C  metroNIDAZOLE (FLAGYL) 500 MG tablet Take 1 tablet (500 mg total) by mouth 2 (two) times daily. 01/18/21  Yes Rayna Sexton, PA-C  meloxicam (MOBIC) 7.5 MG tablet Take 1 tablet (7.5 mg total) by mouth daily. Patient not taking: Reported on 01/18/2021 11/07/18   Lucrezia Starch, MD  naproxen (NAPROSYN) 500 MG tablet Take 1 tablet (500 mg total) by mouth 2 (two) times daily. Patient not taking: Reported on 01/18/2021 05/10/15   Shary Decamp, PA-C  ondansetron (ZOFRAN ODT) 4 MG disintegrating tablet Take 1 tablet (4 mg total) by mouth every 8 (eight) hours as needed for nausea. Patient not taking: Reported on 01/18/2021 05/18/16   Larene Pickett, PA-C      Allergies    Other    Review of Systems   Review of Systems  All other systems reviewed and are negative. Ten systems reviewed and are negative for acute change, except as  noted in the HPI.   Physical Exam Updated Vital Signs BP 118/67 (BP Location: Left Arm)    Pulse 60    Temp 98.1 F (36.7 C) (Oral)    Resp 16    Ht 5\' 6"  (1.676 m)    Wt 49.9 kg    SpO2 99%    BMI 17.75 kg/m  Physical Exam Vitals and nursing note reviewed.  Constitutional:      General: He is not in acute distress.    Appearance: Normal appearance. He is not ill-appearing, toxic-appearing or diaphoretic.  HENT:     Head: Normocephalic and atraumatic.     Right Ear: External ear normal.     Left Ear: External ear normal.     Nose: Nose normal.     Mouth/Throat:     Mouth: Mucous membranes are moist.     Pharynx: Oropharynx is clear. No oropharyngeal exudate or posterior oropharyngeal erythema.  Eyes:     Extraocular Movements: Extraocular movements intact.  Cardiovascular:     Rate and Rhythm: Normal rate and regular rhythm.     Pulses: Normal pulses.     Heart sounds: Normal heart sounds. No murmur heard.   No friction rub. No gallop.  Pulmonary:     Effort: Pulmonary effort is normal. No respiratory distress.     Breath sounds: Normal breath sounds. No stridor. No wheezing, rhonchi or rales.  Abdominal:  General: Abdomen is flat.     Palpations: Abdomen is soft.     Tenderness: There is generalized abdominal tenderness.     Comments: Abdomen is flat and soft.  Mild diffuse tenderness noted that appears to be worst along the epigastrium.  Genitourinary:    Comments: Nursing chaperone present.  Normal-appearing anal region.  No visible or palpable hemorrhoids noted.  Small amount of brown stool noted in the rectal vault with mixed red blood.  No tenderness appreciated throughout the exam. Musculoskeletal:        General: Normal range of motion.     Cervical back: Normal range of motion and neck supple. No tenderness.  Skin:    General: Skin is warm and dry.  Neurological:     General: No focal deficit present.     Mental Status: He is alert and oriented to person, place,  and time.  Psychiatric:        Mood and Affect: Mood normal.        Behavior: Behavior normal.    ED Results / Procedures / Treatments   Labs (all labs ordered are listed, but only abnormal results are displayed) Labs Reviewed  CBC WITH DIFFERENTIAL/PLATELET - Abnormal; Notable for the following components:      Result Value   WBC 10.9 (*)    All other components within normal limits  COMPREHENSIVE METABOLIC PANEL - Abnormal; Notable for the following components:   Glucose, Bld 103 (*)    All other components within normal limits  POC OCCULT BLOOD, ED - Abnormal; Notable for the following components:   Fecal Occult Bld POSITIVE (*)    All other components within normal limits  RESP PANEL BY RT-PCR (FLU A&B, COVID) ARPGX2  C DIFFICILE QUICK SCREEN W PCR REFLEX    GASTROINTESTINAL PANEL BY PCR, STOOL (REPLACES STOOL CULTURE)  LIPASE, BLOOD  URINALYSIS, ROUTINE W REFLEX MICROSCOPIC   EKG None  Radiology CT Angio Abd/Pel W and/or Wo Contrast  Result Date: 01/17/2021 CLINICAL DATA:  Lower GI bleed. Hematochezia for 5 days. Bloody diarrhea and nausea. EXAM: CTA ABDOMEN AND PELVIS WITHOUT AND WITH CONTRAST TECHNIQUE: Multidetector CT imaging of the abdomen and pelvis was performed using the standard protocol during bolus administration of intravenous contrast. Multiplanar reconstructed images and MIPs were obtained and reviewed to evaluate the vascular anatomy. CONTRAST:  28mL OMNIPAQUE IOHEXOL 350 MG/ML SOLN COMPARISON:  None. FINDINGS: VASCULAR Aorta: Normal caliber aorta without aneurysm, dissection, vasculitis or significant stenosis. Celiac: Patent without evidence of aneurysm, dissection, vasculitis or significant stenosis. SMA: Patent without evidence of aneurysm, dissection, vasculitis or significant stenosis. Renals: Both renal arteries are patent without evidence of aneurysm, dissection, vasculitis, fibromuscular dysplasia or significant stenosis. Early branching of left renal  artery. IMA: Patent without evidence of aneurysm, dissection, vasculitis or significant stenosis. Inflow: Patent without evidence of aneurysm, dissection, vasculitis or significant stenosis. Proximal Outflow: Bilateral common femoral and visualized portions of the superficial and profunda femoral arteries are patent without evidence of aneurysm, dissection, vasculitis or significant stenosis. Veins: Portal venous phase imaging demonstrates patency of the portal, splenic, and superior mesenteric veins. Unremarkable appearance of the IVC and iliac veins. Review of the MIP images confirms the above findings. NON-VASCULAR Lower chest: Clear lung bases. No acute airspace disease or pleural effusion. The heart is normal in size. Hepatobiliary: No focal liver abnormality is seen. No gallstones, gallbladder wall thickening, or biliary dilatation. Pancreas: No ductal dilatation or inflammation. Spleen: Normal in size without focal abnormality. Adrenals/Urinary Tract:  Normal adrenal glands. No hydronephrosis or perinephric edema. Homogeneous renal enhancement. Tiny cyst in the mid left kidney. Urinary bladder is nondistended and not well evaluated. Stomach/Bowel: There is some high-density ingested material within the colon which limits evaluation for active extravasation. Allowing for these limitations. There is no extravasation within the GI tract to localize site of GI bleed. Paucity of intra-abdominal fat limits detailed bowel assessment. Decompressed stomach. Small bowel is located in the right abdomen. Occasional fluid-filled small bowel without bowel wall thickening or inflammatory change. No abnormal bowel distension to suggest obstruction. The cecum is located in the mid left abdomen. The appendix is not definitively visualized. Probable wall thickening of the transverse, descending, and sigmoid colon. No visualized colonic diverticula. Lymphatic: No bulky abdominopelvic adenopathy, detailed assessment is limited due  to paucity of intra-abdominal fat. Reproductive: Prostate is unremarkable. Other: No free air. No ascites or free fluid. No abdominal wall hernia. Musculoskeletal: No acute or significant osseous findings. Hemi transitional lumbosacral anatomy with enlarged right transverse process of L5. IMPRESSION: 1. No extravasation within the GI tract to localize site of GI bleed. Probable wall thickening/colitis of the transverse, descending, and sigmoid colon. 2. Suggestion of bowel malrotation, with cecum tentatively visualized in the mid left abdomen, and small bowel located on the right. Paucity of intra-abdominal fat limits detailed bowel assessment. No evidence of bowel ischemia or obstruction. 3. No other acute findings in the abdomen/pelvis. Electronically Signed   By: Keith Rake M.D.   On: 01/17/2021 23:40    Procedures Procedures    Medications Ordered in ED Medications  morphine 4 MG/ML injection 4 mg (4 mg Intravenous Given 01/17/21 2305)  iohexol (OMNIPAQUE) 350 MG/ML injection 80 mL (80 mLs Intravenous Contrast Given 01/17/21 2316)    ED Course/ Medical Decision Making/ A&P                           Medical Decision Making  Pt is a 36 y.o. male who presents to the emergency department due to abdominal pain as well as bloody diarrhea.  Labs: CBC with a white count of 10.9. CMP with a glucose of 103. Lipase of 27. Respiratory panel is negative. C. difficile quick screen with PCR reflex is negative. PCR panel by PCR is pending. Fecal occult blood test is positive.  Imaging: CTA of the abdomen shows IMPRESSION: 1. No extravasation within the GI tract to localize site of GI bleed. Probable wall thickening/colitis of the transverse, descending, and sigmoid colon. 2. Suggestion of bowel malrotation, with cecum tentatively visualized in the mid left abdomen, and small bowel located on the right. Paucity of intra-abdominal fat limits detailed bowel assessment. No evidence of bowel ischemia  or obstruction. 3. No other acute findings in the abdomen/pelvis.   I, Rayna Sexton, PA-C, personally reviewed and evaluated these images and lab results as part of my medical decision-making.  CT scan with findings as noted above.  CBC with a mild leukocytosis of 10.9.  No electrolyte derangements noted on CMP.  Respiratory panel is negative.  Hemoglobin is stable at 15.8.  Patient treated with a dose of morphine and he notes significant improvement in his pain.  No recent travel.  No recent antibiotic use.  Denies any regular alcohol use or NSAID use.  DDx of viral gastroenteritis, bacterial gastroenteritis, IBD, idiopathic.  GI panel by PCR is pending.  We will treat with Cipro/Flagyl.  Patient given a referral to gastroenterology.  Feel that the  patient is stable for discharge at this time and he is agreeable.  Patient discussed with and evaluated by my attending physician Dr. Quintella Reichert who is in agreement with the above plan.  Discussed return precautions with the patient.  His questions were answered and he was amicable at the time of discharge.  Note: Portions of this report may have been transcribed using voice recognition software. Every effort was made to ensure accuracy; however, inadvertent computerized transcription errors may be present.   Final Clinical Impression(s) / ED Diagnoses Final diagnoses:  Blood in stool  Generalized abdominal pain  Colitis   Rx / DC Orders ED Discharge Orders          Ordered    ciprofloxacin (CIPRO) 500 MG tablet  Every 12 hours        01/18/21 0140    metroNIDAZOLE (FLAGYL) 500 MG tablet  2 times daily        01/18/21 0140              Rayna Sexton, PA-C 01/18/21 0334    Quintella Reichert, MD 01/19/21 [phone]

## 2021-01-17 NOTE — ED Provider Triage Note (Signed)
Emergency Medicine Provider Triage Evaluation Note  James Horton , a 36 y.o. male  was evaluated in triage.  36 year old otherwise healthy male presented to the ER today for evaluation abdominal pain diarrhea and occasional blood in his stool for the past 5 days.  No sick contacts, no new foods.  Patient describes diffuse abdominal pain as a cramping "bubbly" sensation, improves somewhat after bowel movement, pain is moderate intensity, constant and does not radiate.  Patient reports some associated nausea.  Patient denies fever, chills, chest pain/shortness of breath, cough, vomiting, dysuria/hematuria, melena or any additional concerns.  Review of Systems  Positive: Nausea, diarrhea, BRB in stool Negative: Patient denies fever, chills, chest pain/shortness of breath, cough, vomiting, dysuria/hematuria, melena or any additional concerns.  Physical Exam  BP 127/87 (BP Location: Right Arm)    Pulse 72    Temp 97.7 F (36.5 C) (Oral)    Resp 16    SpO2 98%  Gen:   Awake, no distress   Resp:  Normal effort  MSK:   Moves extremities without difficulty  Other:  Abdomen soft nontender without peritoneal signs.  Medical Decision Making  Medically screening exam initiated at 8:52 AM.  Appropriate orders placed.  James Horton was informed that the remainder of the evaluation will be completed by another provider, this initial triage assessment does not replace that evaluation, and the importance of remaining in the ED until their evaluation is complete.  ABD pain diarrhea x5 days.  Abdomen nontender.  Labs ordered.  Note: Portions of this report may have been transcribed using voice recognition software. Every effort was made to ensure accuracy; however, inadvertent computerized transcription errors may still be present.    Deliah Boston, [place] 01/17/21 [phone]

## 2021-01-18 LAB — GASTROINTESTINAL PANEL BY PCR, STOOL (REPLACES STOOL CULTURE)

## 2021-01-18 LAB — C DIFFICILE QUICK SCREEN W PCR REFLEX
C Diff antigen: NEGATIVE
C Diff interpretation: NOT DETECTED
C Diff toxin: NEGATIVE

## 2021-01-18 MED ORDER — LORAZEPAM 2 MG/ML IJ SOLN: 2.0000 mg | Freq: Once | INTRAMUSCULAR | Status: DC

## 2021-01-18 MED ORDER — METRONIDAZOLE 500 MG PO TABS: 500.0000 mg | ORAL_TABLET | Freq: Two times a day (BID) | ORAL | 0 refills | Status: AC

## 2021-01-18 MED ORDER — CIPROFLOXACIN HCL 500 MG PO TABS
500.0000 mg | ORAL_TABLET | Freq: Two times a day (BID) | ORAL | 0 refills | Status: AC
Start: 2021-01-18 — End: ?

## 2021-01-18 NOTE — Discharge Instructions (Addendum)
I prescribed you 2 antibiotics.  The first antibiotic is called ciprofloxacin.  Please take this twice a day for the next 5 days.  The other antibiotic is called Flagyl.  Please take this twice a day for the next 7 days.  Please do not stop taking these antibiotics early.  Below is the contact information for [facility] gastroenterology.  Please give them a call soon as possible to schedule an appointment for reevaluation.  If you develop any new or worsening symptoms please come back to the emergency department immediately.  It was a pleasure to meet you.

## 2021-01-19 ENCOUNTER — Other Ambulatory Visit: Payer: Self-pay

## 2021-01-19 ENCOUNTER — Emergency Department (HOSPITAL_COMMUNITY): Payer: Self-pay

## 2021-01-19 ENCOUNTER — Emergency Department (HOSPITAL_COMMUNITY)
Admission: EM | Admit: 2021-01-19 | Discharge: 2021-01-19 | Disposition: A | Discharge status: Home / Self Care | Payer: Self-pay | Attending: Emergency Medicine | Admitting: Emergency Medicine

## 2021-01-19 ENCOUNTER — Encounter (HOSPITAL_COMMUNITY): Payer: Self-pay

## 2021-01-19 DIAGNOSIS — K921 Melena: Secondary | ICD-10-CM | POA: Insufficient documentation

## 2021-01-19 DIAGNOSIS — R1084 Generalized abdominal pain: Secondary | ICD-10-CM | POA: Insufficient documentation

## 2021-01-19 LAB — CBC WITH DIFFERENTIAL/PLATELET
Abs Immature Granulocytes: 0.04 10*3/uL (ref 0.00–0.07)
Basophils Absolute: 0.1 10*3/uL (ref 0.0–0.1)
Basophils Relative: 1 %
Eosinophils Absolute: 0.1 10*3/uL (ref 0.0–0.5)
Eosinophils Relative: 1 %
HCT: 46.3 % (ref 39.0–52.0)
Hemoglobin: 16.1 g/dL (ref 13.0–17.0)
Immature Granulocytes: 0 %
Lymphocytes Relative: 23 %
Lymphs Abs: 2.3 10*3/uL (ref 0.7–4.0)
MCH: 31.3 pg (ref 26.0–34.0)
MCHC: 34.8 g/dL (ref 30.0–36.0)
MCV: 89.9 fL (ref 80.0–100.0)
Monocytes Absolute: 1.4 10*3/uL — ABNORMAL HIGH (ref 0.1–1.0)
Monocytes Relative: 14 %
Neutro Abs: 6.1 10*3/uL (ref 1.7–7.7)
Neutrophils Relative %: 61 %
Platelets: 358 10*3/uL (ref 150–400)
RBC: 5.15 MIL/uL (ref 4.22–5.81)
RDW: 13.3 % (ref 11.5–15.5)
WBC: 10 10*3/uL (ref 4.0–10.5)
nRBC: 0 % (ref 0.0–0.2)

## 2021-01-19 LAB — COMPREHENSIVE METABOLIC PANEL
ALT: 16 U/L (ref 0–44)
AST: 19 U/L (ref 15–41)
Albumin: 4.4 g/dL (ref 3.5–5.0)
Alkaline Phosphatase: 49 U/L (ref 38–126)
Anion gap: 10 (ref 5–15)
BUN: 16 mg/dL (ref 6–20)
CO2: 25 mmol/L (ref 22–32)
Calcium: 9.3 mg/dL (ref 8.9–10.3)
Chloride: 101 mmol/L (ref 98–111)
Creatinine, Ser: 0.89 mg/dL (ref 0.61–1.24)
GFR, Estimated: >60 mL/min (ref >60)
Glucose, Bld: 96 mg/dL (ref 70–99)
Potassium: 3.5 mmol/L (ref 3.5–5.1)
Sodium: 136 mmol/L (ref 135–145)
Total Bilirubin: 0.9 mg/dL (ref 0.3–1.2)
Total Protein: 8.1 g/dL (ref 6.5–8.1)

## 2021-01-19 LAB — LIPASE, BLOOD: Lipase: 34 U/L (ref 11–51)

## 2021-01-19 MED ORDER — LACTATED RINGERS IV BOLUS
1000.0000 mL | Freq: Once | INTRAVENOUS | Status: AC
  Administered 2021-01-19: 1000 mL via INTRAVENOUS

## 2021-01-19 MED ORDER — DICYCLOMINE HCL 10 MG PO CAPS
10.0000 mg | ORAL_CAPSULE | Freq: Once | ORAL | Status: DC
  Filled 2021-01-19: qty 1

## 2021-01-19 MED ORDER — ONDANSETRON 4 MG PO TBDP
4.0000 mg | ORAL_TABLET | Freq: Three times a day (TID) | ORAL | 0 refills | Status: AC | PRN
Start: 2021-01-19 — End: ?

## 2021-01-19 MED ORDER — ONDANSETRON HCL 4 MG/2ML IJ SOLN
4.0000 mg | Freq: Once | INTRAMUSCULAR | Status: AC
  Administered 2021-01-19: 4 mg via INTRAVENOUS
  Filled 2021-01-19: qty 2

## 2021-01-19 MED ORDER — MORPHINE SULFATE (PF) 4 MG/ML IV SOLN
4.0000 mg | Freq: Once | INTRAVENOUS | Status: AC
  Administered 2021-01-19: 4 mg via INTRAVENOUS
  Filled 2021-01-19: qty 1

## 2021-01-19 NOTE — ED Provider Notes (Signed)
[facility] DEPT Provider Note   CSN: [id number] Arrival date & time: 01/19/21  0056     History  Chief Complaint  Patient presents with   Nausea   Emesis   Diarrhea    James Horton is a 36 y.o. male.  The history is provided by the patient and medical records.  Emesis Associated symptoms: diarrhea   Diarrhea Associated symptoms: vomiting   James Horton is a 36 y.o. male who presents to the Emergency Department complaining of abdominal pain.  He has been experiencing abdominal pain over the last 6 days.  He was evaluated in the emergency department yesterday and discharged home on oral medications.  He states that his symptoms have significantly worsened since ED discharge.  Pain is generalized but greatest over the central abdomen.  He feels like there is a burning feeling and that things want to go through and they are not moving through properly.  His pain was improved when he left the emergency department but after arriving home symptoms returned and worsened.  No associated fever.  He picked up his medications at 10 this morning and then developed vomiting about 1 hour later.  He has had 2 episodes of emesis.  He has experienced persistent diarrhea since leaving the emergency department.  He has had 7-10 bowel movements since ED discharge.  He states the blood initially was not present on his stools and now he has associated bloody stools again.  No known medical problems.  No medications.  No family history of inflammatory bowel disorders.  He does use tobacco.  Rare alcohol use.  He does use marijuana regularly but did not use for the last 2 days.    Home Medications Prior to Admission medications   Medication Sig Start Date End Date Taking? Authorizing Provider  ciprofloxacin (CIPRO) 500 MG tablet Take 1 tablet (500 mg total) by mouth every 12 (twelve) hours. 01/18/21  Yes Rayna Sexton, PA-C  metroNIDAZOLE (FLAGYL) 500 MG tablet Take 1  tablet (500 mg total) by mouth 2 (two) times daily. 01/18/21  Yes Rayna Sexton, PA-C  ondansetron (ZOFRAN-ODT) 4 MG disintegrating tablet Take 1 tablet (4 mg total) by mouth every 8 (eight) hours as needed for nausea or vomiting. 01/19/21  Yes Quintella Reichert, MD      Allergies    Other    Review of Systems   Review of Systems  Gastrointestinal:  Positive for diarrhea and vomiting.  All other systems reviewed and are negative.  Physical Exam Updated Vital Signs BP 108/74 (BP Location: Left Arm)    Pulse 70    Temp 98.2 F (36.8 C) (Oral)    Resp 17    Ht 5\' 6"  (1.676 m)    Wt 49.9 kg    SpO2 98%    BMI 17.75 kg/m  Physical Exam Vitals and nursing note reviewed.  Constitutional:      Appearance: He is well-developed.  HENT:     Head: Normocephalic and atraumatic.  Cardiovascular:     Rate and Rhythm: Normal rate and regular rhythm.     Heart sounds: No murmur heard. Pulmonary:     Effort: Pulmonary effort is normal. No respiratory distress.     Breath sounds: Normal breath sounds.  Abdominal:     Palpations: Abdomen is soft.     Tenderness: There is no guarding or rebound.     Comments: Moderate generalized abdominal tenderness  Musculoskeletal:        General: No  tenderness.  Skin:    General: Skin is warm and dry.  Neurological:     Mental Status: He is alert and oriented to person, place, and time.  Psychiatric:        Behavior: Behavior normal.    ED Results / Procedures / Treatments   Labs (all labs ordered are listed, but only abnormal results are displayed) Labs Reviewed  CBC WITH DIFFERENTIAL/PLATELET - Abnormal; Notable for the following components:      Result Value   Monocytes Absolute 1.4 (*)    All other components within normal limits  COMPREHENSIVE METABOLIC PANEL  LIPASE, BLOOD    EKG None  Radiology DG Abdomen Acute W/Chest  Result Date: 01/19/2021 CLINICAL DATA:  Diagnosed with C difficile. Nausea, vomiting, and watery diarrhea. EXAM: DG  ABDOMEN ACUTE WITH 1 VIEW CHEST COMPARISON:  01/17/2021. FINDINGS: There is no evidence of dilated bowel loops or free intraperitoneal air. No radiopaque calculi or other significant radiographic abnormality is seen. Heart size and mediastinal contours are within normal limits. Both lungs are clear. IMPRESSION: Negative abdominal radiographs.  No acute cardiopulmonary disease. Electronically Signed   By: Brett Fairy M.D.   On: 01/19/2021 02:59   CT Angio Abd/Pel W and/or Wo Contrast  Result Date: 01/17/2021 CLINICAL DATA:  Lower GI bleed. Hematochezia for 5 days. Bloody diarrhea and nausea. EXAM: CTA ABDOMEN AND PELVIS WITHOUT AND WITH CONTRAST TECHNIQUE: Multidetector CT imaging of the abdomen and pelvis was performed using the standard protocol during bolus administration of intravenous contrast. Multiplanar reconstructed images and MIPs were obtained and reviewed to evaluate the vascular anatomy. CONTRAST:  62mL OMNIPAQUE IOHEXOL 350 MG/ML SOLN COMPARISON:  None. FINDINGS: VASCULAR Aorta: Normal caliber aorta without aneurysm, dissection, vasculitis or significant stenosis. Celiac: Patent without evidence of aneurysm, dissection, vasculitis or significant stenosis. SMA: Patent without evidence of aneurysm, dissection, vasculitis or significant stenosis. Renals: Both renal arteries are patent without evidence of aneurysm, dissection, vasculitis, fibromuscular dysplasia or significant stenosis. Early branching of left renal artery. IMA: Patent without evidence of aneurysm, dissection, vasculitis or significant stenosis. Inflow: Patent without evidence of aneurysm, dissection, vasculitis or significant stenosis. Proximal Outflow: Bilateral common femoral and visualized portions of the superficial and profunda femoral arteries are patent without evidence of aneurysm, dissection, vasculitis or significant stenosis. Veins: Portal venous phase imaging demonstrates patency of the portal, splenic, and superior  mesenteric veins. Unremarkable appearance of the IVC and iliac veins. Review of the MIP images confirms the above findings. NON-VASCULAR Lower chest: Clear lung bases. No acute airspace disease or pleural effusion. The heart is normal in size. Hepatobiliary: No focal liver abnormality is seen. No gallstones, gallbladder wall thickening, or biliary dilatation. Pancreas: No ductal dilatation or inflammation. Spleen: Normal in size without focal abnormality. Adrenals/Urinary Tract: Normal adrenal glands. No hydronephrosis or perinephric edema. Homogeneous renal enhancement. Tiny cyst in the mid left kidney. Urinary bladder is nondistended and not well evaluated. Stomach/Bowel: There is some high-density ingested material within the colon which limits evaluation for active extravasation. Allowing for these limitations. There is no extravasation within the GI tract to localize site of GI bleed. Paucity of intra-abdominal fat limits detailed bowel assessment. Decompressed stomach. Small bowel is located in the right abdomen. Occasional fluid-filled small bowel without bowel wall thickening or inflammatory change. No abnormal bowel distension to suggest obstruction. The cecum is located in the mid left abdomen. The appendix is not definitively visualized. Probable wall thickening of the transverse, descending, and sigmoid colon. No visualized colonic diverticula.  Lymphatic: No bulky abdominopelvic adenopathy, detailed assessment is limited due to paucity of intra-abdominal fat. Reproductive: Prostate is unremarkable. Other: No free air. No ascites or free fluid. No abdominal wall hernia. Musculoskeletal: No acute or significant osseous findings. Hemi transitional lumbosacral anatomy with enlarged right transverse process of L5. IMPRESSION: 1. No extravasation within the GI tract to localize site of GI bleed. Probable wall thickening/colitis of the transverse, descending, and sigmoid colon. 2. Suggestion of bowel  malrotation, with cecum tentatively visualized in the mid left abdomen, and small bowel located on the right. Paucity of intra-abdominal fat limits detailed bowel assessment. No evidence of bowel ischemia or obstruction. 3. No other acute findings in the abdomen/pelvis. Electronically Signed   By: Keith Rake M.D.   On: 01/17/2021 23:40    Procedures Procedures    Medications Ordered in ED Medications  lactated ringers bolus 1,000 mL (0 mLs Intravenous Stopped 01/19/21 0259)  morphine 4 MG/ML injection 4 mg (4 mg Intravenous Given 01/19/21 0114)  ondansetron (ZOFRAN) injection 4 mg (4 mg Intravenous Given 01/19/21 0113)  ondansetron (ZOFRAN) injection 4 mg (4 mg Intravenous Given 01/19/21 0455)    ED Course/ Medical Decision Making/ A&P                           Medical Decision Making  Patient here for evaluation of abdominal pain, bloody stools.  Recently seen the day prior with similar symptoms and had CTA performed at that time.  His primary complaint is vomiting since he started the antibiotics but he does have ongoing abdominal pain.  Patient with initially moderate abdominal tenderness, on reassessment his tenderness is mild in nature.  No peritoneal findings.  CBC is stable.  Do not feel that additional more imaging is warranted at this time.  His C. difficile and GI panel are negative.  Patient is intolerant to the oral antibiotics and feel that is safe for him to discontinue them at this time.  He is tolerating orals in the emergency department.  Plan to discharge home with GI follow-up as well as close return precautions for progressive or concerning symptoms.        Final Clinical Impression(s) / ED Diagnoses Final diagnoses:  Generalized abdominal pain  Bloody stools    Rx / DC Orders ED Discharge Orders          Ordered    ondansetron (ZOFRAN-ODT) 4 MG disintegrating tablet  Every 8 hours PRN        01/19/21 0451              Quintella Reichert, MD 01/19/21  [phone]

## 2021-01-19 NOTE — ED Notes (Signed)
Patient given water for fluid challenge.  

## 2021-01-19 NOTE — ED Triage Notes (Signed)
Patient BIB [facility] from home. Diagnosed with C Dif 1/6. Unable to keep the medications down, because he was not prescribed zofran. Patient started Cipro and metronidazole. Nausea, vomiting and watery diarrhea.   EMS 118/60 98% room air CBG 101 HR 65

## 2022-04-13 IMAGING — DX DG ABDOMEN ACUTE W/ 1V CHEST
3 series · 3 of 3 positions shown · non-contrast
Comparison: 01/17/2021.

CLINICAL DATA: Diagnosed with C difficile. Nausea, vomiting, and
watery diarrhea.

EXAM:
DG ABDOMEN ACUTE WITH 1 VIEW CHEST

[chest ap]
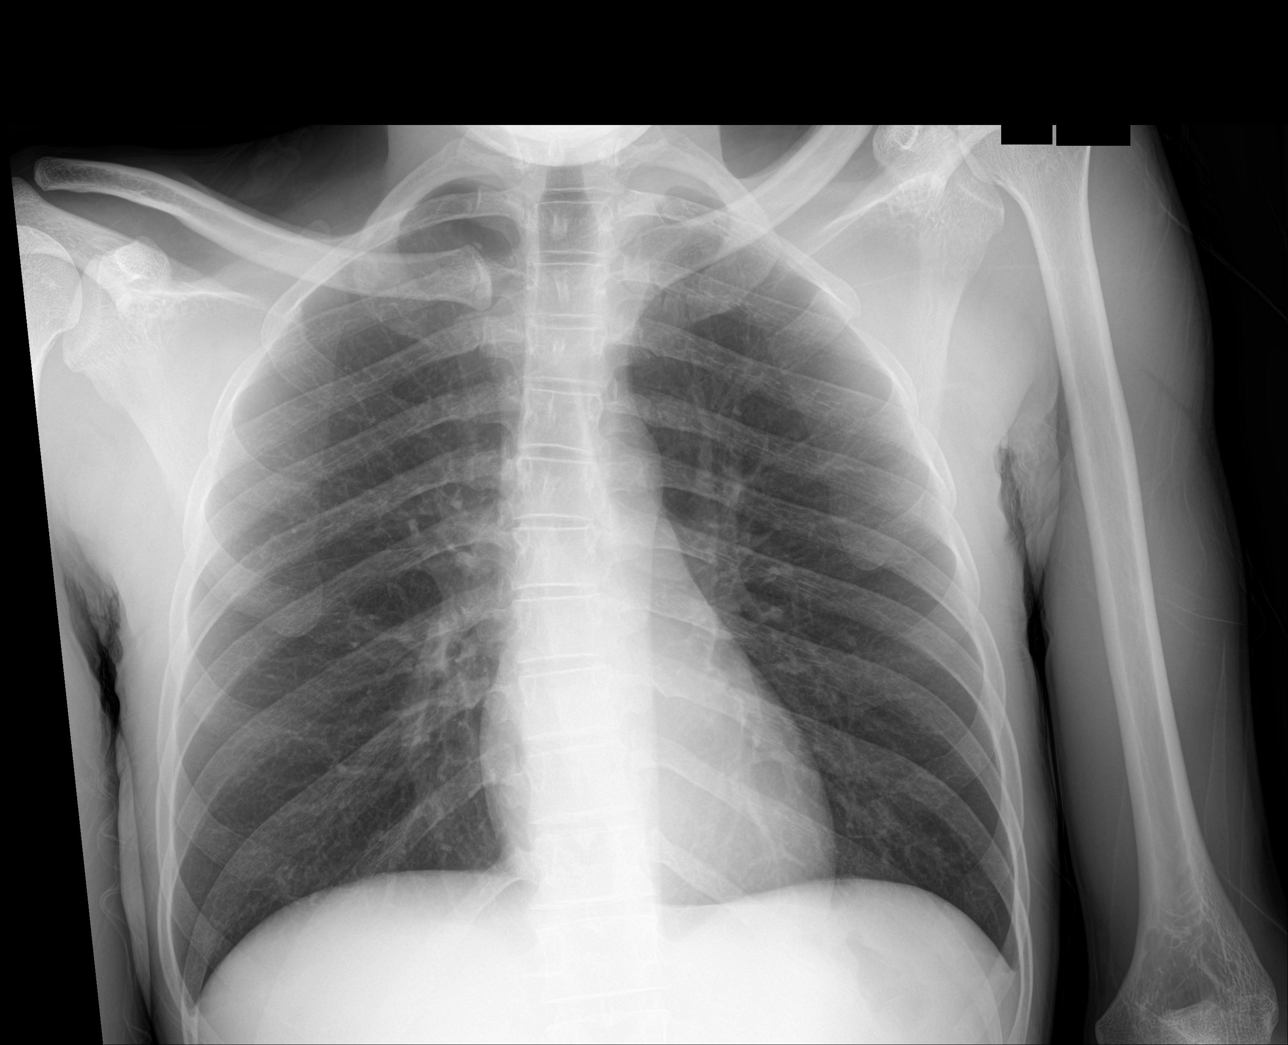

[abdomen erect]
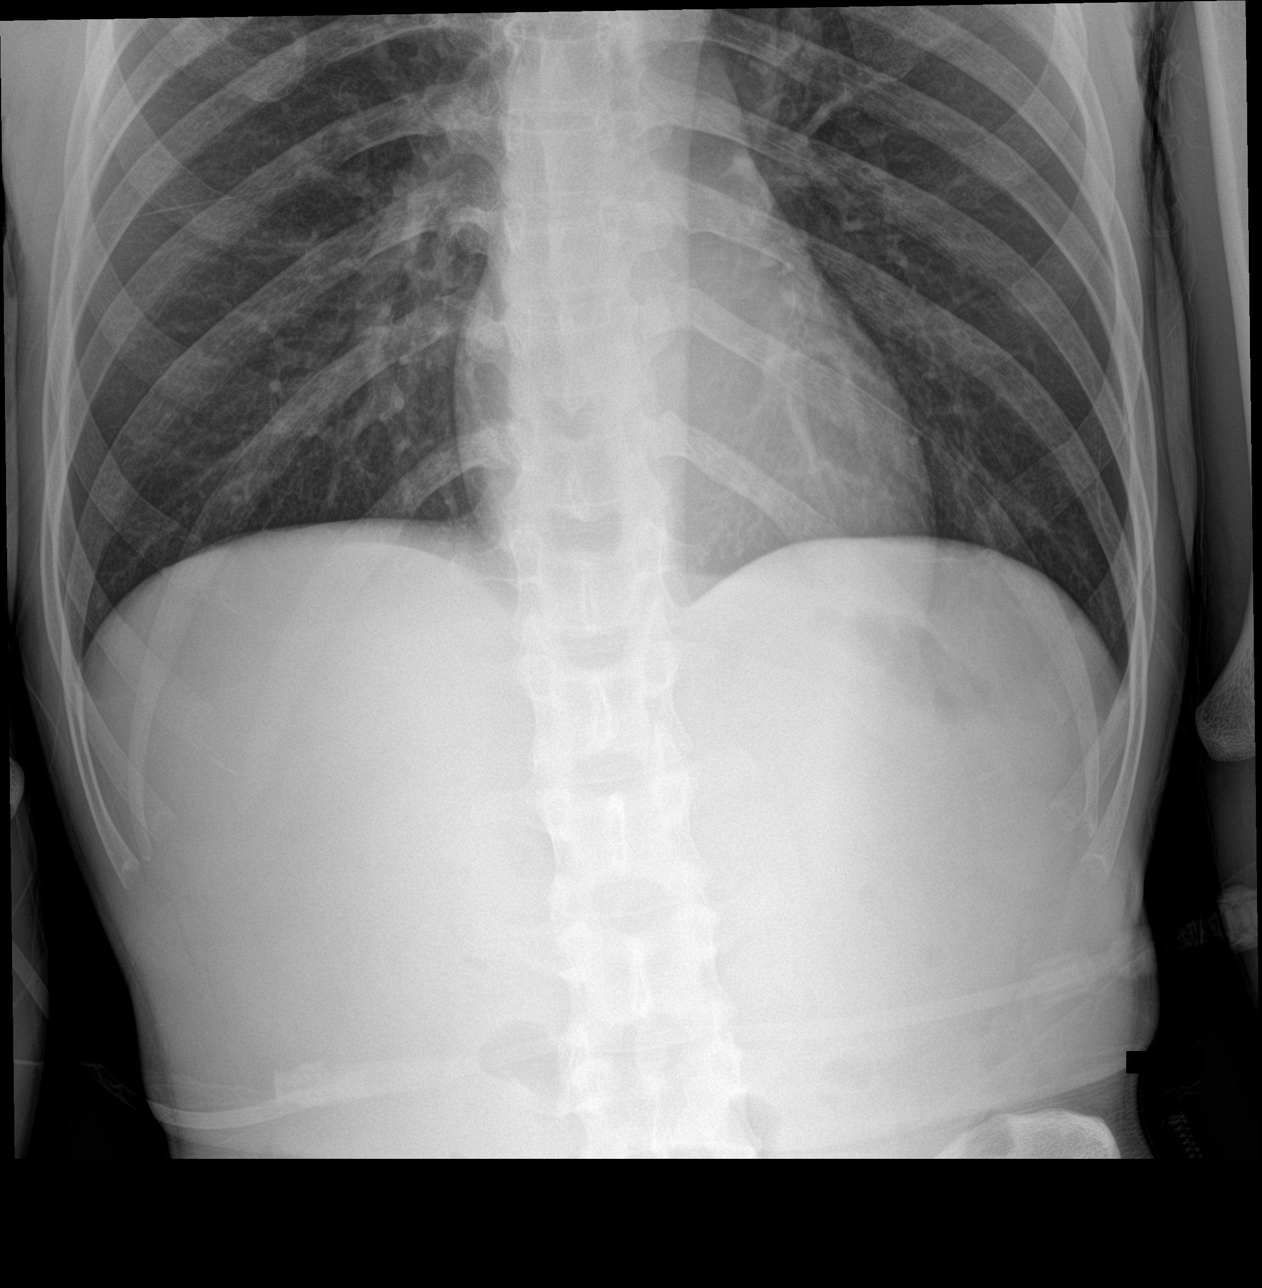

[abdomen supine]
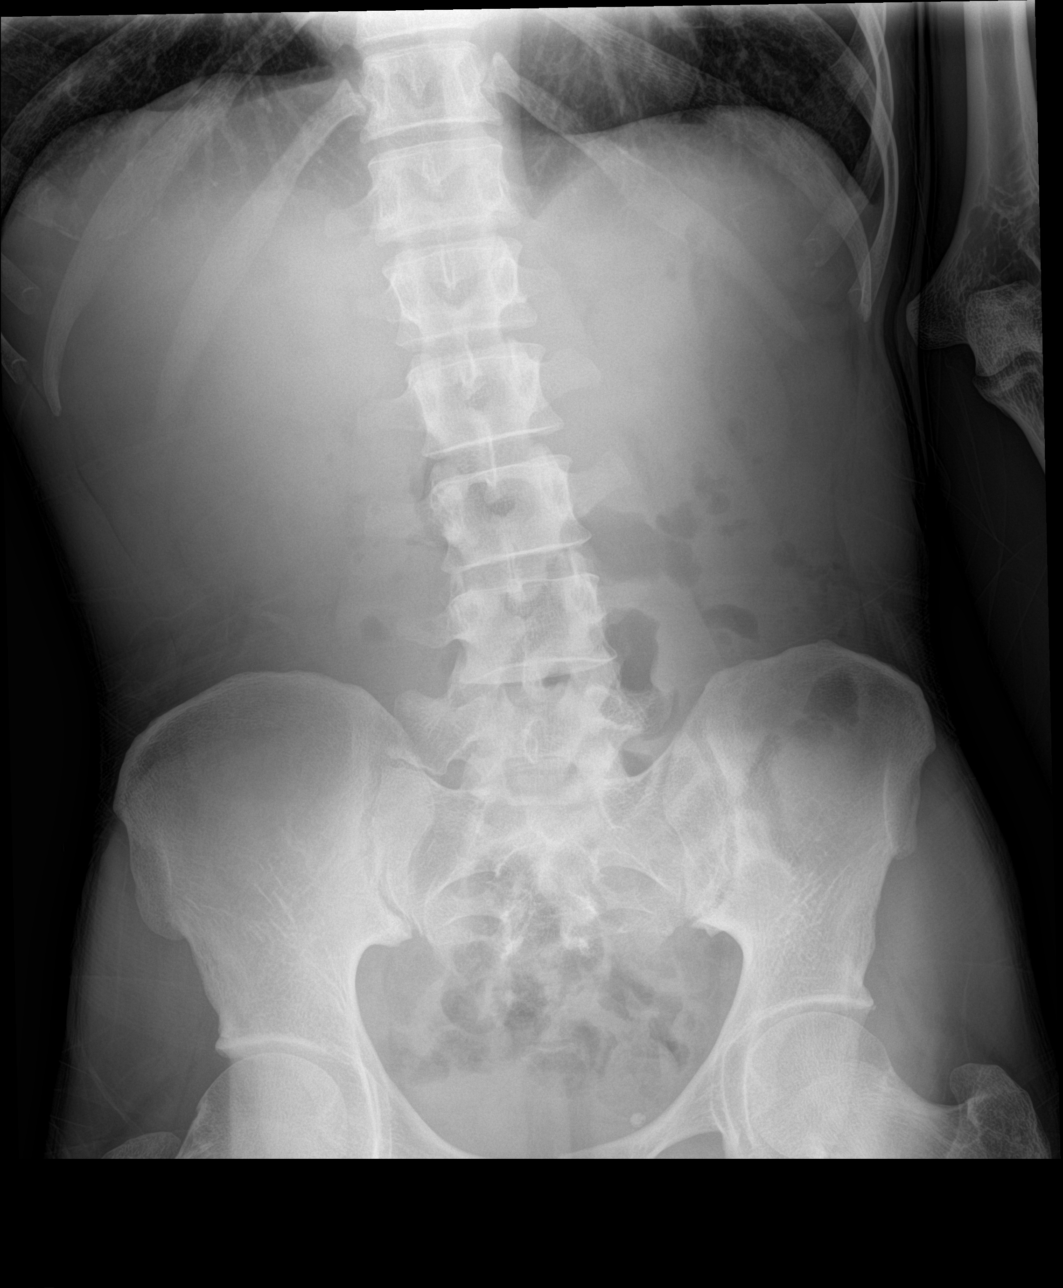

[3 of 3 positions shown; findings below may reference images not displayed]

FINDINGS: There is no evidence of dilated bowel loops or free intraperitoneal
air. No radiopaque calculi or other significant radiographic
abnormality is seen. Heart size and mediastinal contours are within
normal limits. Both lungs are clear.
IMPRESSION: Negative abdominal radiographs.  No acute cardiopulmonary disease.
